# Patient Record
Sex: Male | Born: 1980 | Race: White | Hispanic: No | Marital: Married | Smoking: Current every day smoker
Health system: Southern US, Community
[De-identification: ages and names within clinical notes are randomized; demographics above are authoritative.]

---

## 2011-04-21 ENCOUNTER — Emergency Department (HOSPITAL_COMMUNITY): Payer: 59

## 2011-04-21 ENCOUNTER — Inpatient Hospital Stay (HOSPITAL_COMMUNITY)
Admission: EM | Admit: 2011-04-21 | Discharge: 2011-04-23 | DRG: 558 | Disposition: A | Discharge status: Home / Self Care | Payer: 59 | Attending: Internal Medicine | Admitting: Internal Medicine

## 2011-04-21 ENCOUNTER — Encounter: Payer: Self-pay | Admitting: Internal Medicine

## 2011-04-21 ENCOUNTER — Other Ambulatory Visit: Payer: Self-pay | Admitting: Emergency Medicine

## 2011-04-21 DIAGNOSIS — N179 Acute kidney failure, unspecified: Secondary | ICD-10-CM

## 2011-04-21 DIAGNOSIS — R51 Headache: Secondary | ICD-10-CM | POA: Diagnosis present

## 2011-04-21 DIAGNOSIS — M6282 Rhabdomyolysis: Principal | ICD-10-CM | POA: Diagnosis present

## 2011-04-21 LAB — CREATININE, URINE, RANDOM: Creatinine, Urine: 77.11 mg/dL

## 2011-04-21 LAB — COMPREHENSIVE METABOLIC PANEL
ALT: 57 U/L — ABNORMAL HIGH (ref 0–53)
AST: 22 U/L (ref 0–37)
Alkaline Phosphatase: 74 U/L (ref 39–117)
CO2: 24 mEq/L (ref 19–32)
Calcium: 8.9 mg/dL (ref 8.4–10.5)
Chloride: 102 mEq/L (ref 96–112)
GFR calc non Af Amer: 15 mL/min — ABNORMAL LOW (ref >60)
Glucose, Bld: 91 mg/dL (ref 70–99)
Potassium: 4.4 mEq/L (ref 3.5–5.1)
Sodium: 136 mEq/L (ref 135–145)
Total Bilirubin: 0.6 mg/dL (ref 0.3–1.2)

## 2011-04-21 LAB — CBC
Hemoglobin: 14.8 g/dL (ref 13.0–17.0)
Platelets: 216 10*3/uL (ref 150–400)
RBC: 5.38 MIL/uL (ref 4.22–5.81)
WBC: 10.1 10*3/uL (ref 4.0–10.5)

## 2011-04-21 LAB — DIFFERENTIAL
Basophils Absolute: 0.1 10*3/uL (ref 0.0–0.1)
Basophils Relative: 1 % (ref 0–1)
Eosinophils Absolute: 0.2 10*3/uL (ref 0.0–0.7)
Neutro Abs: 6.6 10*3/uL (ref 1.7–7.7)
Neutrophils Relative %: 65 % (ref 43–77)

## 2011-04-21 LAB — BASIC METABOLIC PANEL
Calcium: 8.7 mg/dL (ref 8.4–10.5)
Chloride: 102 mEq/L (ref 96–112)
Creatinine, Ser: 4.48 mg/dL — ABNORMAL HIGH (ref 0.50–1.35)
GFR calc Af Amer: 19 mL/min — ABNORMAL LOW (ref >60)
Sodium: 136 mEq/L (ref 135–145)

## 2011-04-21 LAB — RAPID URINE DRUG SCREEN, HOSP PERFORMED
Barbiturates: NOT DETECTED
Opiates: NOT DETECTED
Tetrahydrocannabinol: NOT DETECTED

## 2011-04-21 LAB — HIV ANTIBODY (ROUTINE TESTING W REFLEX): HIV: NONREACTIVE

## 2011-04-21 LAB — MICROALBUMIN, URINE: Microalb, Ur: 3.34 mg/dL — ABNORMAL HIGH (ref 0.00–1.89)

## 2011-04-21 LAB — URINALYSIS, ROUTINE W REFLEX MICROSCOPIC
Bilirubin Urine: NEGATIVE
Glucose, UA: NEGATIVE mg/dL
Protein, ur: NEGATIVE mg/dL

## 2011-04-21 LAB — PROTIME-INR: Prothrombin Time: 13.8 seconds (ref 11.6–15.2)

## 2011-04-21 LAB — URINE MICROSCOPIC-ADD ON

## 2011-04-21 LAB — SODIUM, URINE, RANDOM: Sodium, Ur: 49 mEq/L

## 2011-04-21 MED ORDER — IOHEXOL 300 MG/ML  SOLN
100.0000 mL | Freq: Once | INTRAMUSCULAR | Status: AC | PRN
  Administered 2011-04-21: 100 mL via INTRAVENOUS
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
## 2011-04-22 LAB — URINE CULTURE: Culture: NO GROWTH

## 2011-04-22 LAB — RENAL FUNCTION PANEL
Albumin: 3.3 g/dL — ABNORMAL LOW (ref 3.5–5.2)
Calcium: 8.5 mg/dL (ref 8.4–10.5)
GFR calc Af Amer: 20 mL/min — ABNORMAL LOW (ref >60)
GFR calc non Af Amer: 16 mL/min — ABNORMAL LOW (ref >60)
Phosphorus: 4.5 mg/dL (ref 2.3–4.6)
Sodium: 142 mEq/L (ref 135–145)

## 2011-04-22 LAB — C4 COMPLEMENT: Complement C4, Body Fluid: 29 mg/dL (ref 10–40)

## 2011-04-22 LAB — CBC
MCH: 28.9 pg (ref 26.0–34.0)
MCHC: 35.8 g/dL (ref 30.0–36.0)
Platelets: 200 10*3/uL (ref 150–400)

## 2011-04-23 LAB — PROTEIN ELECTROPH W RFLX QUANT IMMUNOGLOBULINS
Alpha-1-Globulin: 5 % — ABNORMAL HIGH (ref 2.9–4.9)
Beta 2: 5.6 % (ref 3.2–6.5)
Beta Globulin: 4.6 % — ABNORMAL LOW (ref 4.7–7.2)
Gamma Globulin: 14 % (ref 11.1–18.8)

## 2011-04-23 LAB — BASIC METABOLIC PANEL
BUN: 19 mg/dL (ref 6–23)
CO2: 30 mEq/L (ref 19–32)
Chloride: 104 mEq/L (ref 96–112)
Creatinine, Ser: 3.04 mg/dL — ABNORMAL HIGH (ref 0.50–1.35)
GFR calc Af Amer: 30 mL/min — ABNORMAL LOW (ref >60)
Glucose, Bld: 88 mg/dL (ref 70–99)

## 2011-04-23 LAB — UIFE/LIGHT CHAINS/TP QN, 24-HR UR
Alpha 1, Urine: DETECTED — AB
Alpha 2, Urine: DETECTED — AB
Free Kappa/Lambda Ratio: 10.29 ratio (ref 2.04–10.37)
Total Protein, Urine: 4.8 mg/dL

## 2011-04-23 LAB — RENAL FUNCTION PANEL
Calcium: 8.6 mg/dL (ref 8.4–10.5)
Creatinine, Ser: 3.31 mg/dL — ABNORMAL HIGH (ref 0.50–1.35)
GFR calc Af Amer: 27 mL/min — ABNORMAL LOW (ref >60)
Glucose, Bld: 93 mg/dL (ref 70–99)
Phosphorus: 4.5 mg/dL (ref 2.3–4.6)
Sodium: 143 mEq/L (ref 135–145)

## 2011-04-23 LAB — COMPLEMENT, TOTAL: Compl, Total (CH50): 39 U/mL (ref 31–60)

## 2011-04-24 LAB — GLUCOSE, CAPILLARY
Comment 1: 27532
Glucose-Capillary: 165 mg/dL — ABNORMAL HIGH (ref 70–99)
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
## 2011-04-25 ENCOUNTER — Other Ambulatory Visit (INDEPENDENT_AMBULATORY_CARE_PROVIDER_SITE_OTHER): Payer: 59 | Admitting: Internal Medicine

## 2011-04-25 DIAGNOSIS — N179 Acute kidney failure, unspecified: Secondary | ICD-10-CM

## 2011-04-25 DIAGNOSIS — M6282 Rhabdomyolysis: Secondary | ICD-10-CM

## 2011-04-25 LAB — MYOGLOBIN, URINE
Myoglobin, Ur: 59 mcg/L — ABNORMAL HIGH (ref <28)
Myoglobin, Ur: <27 mcg/L (ref <28)

## 2011-04-26 ENCOUNTER — Encounter: Payer: 59 | Admitting: Internal Medicine

## 2011-04-26 DIAGNOSIS — M6282 Rhabdomyolysis: Secondary | ICD-10-CM

## 2011-04-26 DIAGNOSIS — N179 Acute kidney failure, unspecified: Secondary | ICD-10-CM

## 2011-04-27 ENCOUNTER — Telehealth: Payer: Self-pay | Admitting: Internal Medicine

## 2011-04-27 LAB — BASIC METABOLIC PANEL
CO2: 27 mEq/L (ref 19–32)
Calcium: 9.8 mg/dL (ref 8.4–10.5)
Chloride: 103 mEq/L (ref 96–112)
Creat: 2.4 mg/dL — ABNORMAL HIGH (ref 0.50–1.35)
Potassium: 4.5 mEq/L (ref 3.5–5.3)
Sodium: 141 mEq/L (ref 135–145)

## 2011-04-27 NOTE — Telephone Encounter (Signed)
Called pt to inform him that his creatinine has continued to trend downward (now to 2.2).  Advised to continue to limit strenuous activity and to remain well hydrated.  Also informed patient that I am setting up a referral to [facility] for further eval of his rhabdomyolysis.  He should expect a call from them to set this up.

## 2011-11-07 ENCOUNTER — Encounter: Payer: 59 | Admitting: Internal Medicine

## 2013-03-08 NOTE — Progress Notes (Signed)
This encounter was created in error - please disregard.

## 2019-05-04 ENCOUNTER — Emergency Department (HOSPITAL_COMMUNITY)
Admission: EM | Admit: 2019-05-04 | Discharge: 2019-05-04 | Disposition: A | Discharge status: Home / Self Care | Payer: BLUE CROSS/BLUE SHIELD | Attending: Emergency Medicine | Admitting: Emergency Medicine

## 2019-05-04 ENCOUNTER — Encounter (HOSPITAL_COMMUNITY): Payer: Self-pay | Admitting: Emergency Medicine

## 2019-05-04 DIAGNOSIS — K625 Hemorrhage of anus and rectum: Secondary | ICD-10-CM

## 2019-05-04 LAB — CBC
HCT: 50.5 % (ref 39.0–52.0)
Hemoglobin: 16.9 g/dL (ref 13.0–17.0)
MCH: 28.7 pg (ref 26.0–34.0)
MCHC: 33.5 g/dL (ref 30.0–36.0)
MCV: 85.9 fL (ref 80.0–100.0)
Platelets: 297 10*3/uL (ref 150–400)
RBC: 5.88 MIL/uL — ABNORMAL HIGH (ref 4.22–5.81)
RDW: 12.5 % (ref 11.5–15.5)
WBC: 10 10*3/uL (ref 4.0–10.5)
nRBC: 0 % (ref 0.0–0.2)

## 2019-05-04 LAB — COMPREHENSIVE METABOLIC PANEL
ALT: 75 U/L — ABNORMAL HIGH (ref 0–44)
AST: 88 U/L — ABNORMAL HIGH (ref 15–41)
Albumin: 5 g/dL (ref 3.5–5.0)
Alkaline Phosphatase: 59 U/L (ref 38–126)
Anion gap: 10 (ref 5–15)
BUN: 11 mg/dL (ref 6–20)
CO2: 26 mmol/L (ref 22–32)
Calcium: 9.7 mg/dL (ref 8.9–10.3)
Chloride: 103 mmol/L (ref 98–111)
Creatinine, Ser: 1.07 mg/dL (ref 0.61–1.24)
GFR calc Af Amer: >60 mL/min (ref >60)
GFR calc non Af Amer: >60 mL/min (ref >60)
Glucose, Bld: 94 mg/dL (ref 70–99)
Potassium: 4 mmol/L (ref 3.5–5.1)
Sodium: 139 mmol/L (ref 135–145)
Total Bilirubin: 1.3 mg/dL — ABNORMAL HIGH (ref 0.3–1.2)
Total Protein: 8.1 g/dL (ref 6.5–8.1)

## 2019-05-04 NOTE — ED Provider Notes (Signed)
[facility] Note   CSN: [id number] Arrival date & time: 05/04/19  1417   History   Chief Complaint Chief Complaint  Patient presents with  . Rectal Bleeding   HPI Sean Andrews is a 38 y.o. male.     HPI   38 year old male presents today with complaints of rectal bleeding.  Patient notes since May he has had intermittent bleeding per rectum with bowel movements.  He notes this is red.  He denies any significant pain with this.  Patient notes it is not every time and is occasional.  He notes recently over the last several weeks it has been more frequent.  He notes blood on the toilet paper and some blood in the toilet bowl.  He notes he has had some epigastric abdominal discomfort over the last couple of days but notes a rib injury and feels this is caused by this.  He denies any change to the stools including dark or tarry stools.  He denies any weight loss or fever or change in consistency of the stools.  He has not had a colonoscopy.  He notes more frequent bowel movements recently.  No personal or family history of Crohn's or IBS.  History reviewed. No pertinent past medical history.  Patient Active Problem List   Diagnosis Date Noted  . Rhabdomyolysis 04/25/2011    History reviewed. No pertinent surgical history.      Home Medications    Prior to Admission medications   Medication Sig Start Date End Date Taking? Authorizing Provider  acetaminophen (TYLENOL) 325 MG tablet Take 325 mg by mouth every 6 (six) hours as needed.      Duwaine Maxinaisch, Michael C, MD    Family History No family history on file.  Social History Social History   Tobacco Use  . Smoking status: Not on file  Substance Use Topics  . Alcohol use: Not on file  . Drug use: Not on file     Allergies   Patient has no known allergies.   Review of Systems Review of Systems  All other systems reviewed and are negative.   Physical Exam Updated  Vital Signs BP 117/75   Pulse 74   Temp 98.2 F (36.8 C) (Oral)   Resp 16   SpO2 98%   Physical Exam Vitals signs and nursing note reviewed.  Constitutional:      Appearance: He is well-developed.  HENT:     Head: Normocephalic and atraumatic.  Eyes:     General: No scleral icterus.       Right eye: No discharge.        Left eye: No discharge.     Conjunctiva/sclera: Conjunctivae normal.     Pupils: Pupils are equal, round, and reactive to light.  Neck:     Musculoskeletal: Normal range of motion.     Vascular: No JVD.     Trachea: No tracheal deviation.  Pulmonary:     Effort: Pulmonary effort is normal.     Breath sounds: No stridor.  Abdominal:     General: There is no distension.     Palpations: Abdomen is soft. There is no mass.     Tenderness: There is no abdominal tenderness. There is no guarding or rebound.     Hernia: No hernia is present.  Neurological:     Mental Status: He is alert and oriented to person, place, and time.     Coordination: Coordination normal.  Psychiatric:  Behavior: Behavior normal.        Thought Content: Thought content normal.        Judgment: Judgment normal.     ED Treatments / Results  Labs (all labs ordered are listed, but only abnormal results are displayed) Labs Reviewed  COMPREHENSIVE METABOLIC PANEL - Abnormal; Notable for the following components:      Result Value   AST 88 (*)    ALT 75 (*)    Total Bilirubin 1.3 (*)    All other components within normal limits  CBC - Abnormal; Notable for the following components:   RBC 5.88 (*)    All other components within normal limits    EKG None  Radiology No results found.  Procedures Procedures (including critical care time)  Medications Ordered in ED Medications - No data to display   Initial Impression / Assessment and Plan / ED Course  I have reviewed the triage vital signs and the nursing notes.  Pertinent labs & imaging results that were available  during my care of the patient were reviewed by me and considered in my medical decision making (see chart for details).        Labs:   Imaging:  Consults:  Therapeutics:  Discharge Meds:   Assessment/Plan: Patient here with red blood per rectum.  This has been intermittent and worsening over last several weeks but has been going on since May.  Patient has reassuring laboratory analysis here.  He has no abdominal tenderness.  No signs of significant blood loss.  I discussed different options including CT scan and rectal exam.  Given the duration of symptoms with otherwise well appearance patient agreed that CT scan would not need to perform presently.  He has no signs of upper GI bleed, this is likely lower high suspicion for hemorrhoids.  Low suspicion for malignancy.  We discussed the need that he would follow-up with gastroenterology for further evaluation.  Given he is not having significant blood loss this is not a new etiology rectal exam was deferred to gastroenterology.  Patient will return immediately if develops any new or worsening signs or symptoms.  He verbalized understanding and agreement to today's plan had no further questions or concerns at time of discharge.   Final Clinical Impressions(s) / ED Diagnoses   Final diagnoses:  Rectal bleeding    ED Discharge Orders    None       Okey Regal, PA-C 05/06/19 1512    Malvin Johns, MD 05/07/19 1723

## 2019-05-04 NOTE — Discharge Instructions (Signed)
Please read attached information. If you experience any new or worsening signs or symptoms please return to the emergency room for evaluation. Please follow-up with your primary care provider or specialist as discussed. Please use medication prescribed only as directed and discontinue taking if you have any concerning signs or symptoms.   °

## 2019-05-04 NOTE — ED Triage Notes (Signed)
Pt reports for the last 3 weeks he has had rectal bleeding that has been coming and going with BM's. Pt states he has noticed at times there is bright red blood in stool. Pt states today he has a pain in the center of his abdomen he feels like it is cramping.

## 2019-05-04 NOTE — ED Notes (Signed)
Discharge instructions discussed with Pt. Pt verbalized understanding. Pt stable and ambulatory.    

## 2019-05-06 ENCOUNTER — Emergency Department (HOSPITAL_BASED_OUTPATIENT_CLINIC_OR_DEPARTMENT_OTHER): Payer: BLUE CROSS/BLUE SHIELD

## 2019-05-06 ENCOUNTER — Encounter (HOSPITAL_BASED_OUTPATIENT_CLINIC_OR_DEPARTMENT_OTHER): Payer: Self-pay

## 2019-05-06 ENCOUNTER — Other Ambulatory Visit: Payer: Self-pay

## 2019-05-06 ENCOUNTER — Ambulatory Visit
Admission: RE | Admit: 2019-05-06 | Discharge: 2019-05-06 | Disposition: A | Discharge status: Home / Self Care | Payer: BLUE CROSS/BLUE SHIELD | Source: Ambulatory Visit

## 2019-05-06 ENCOUNTER — Emergency Department (HOSPITAL_BASED_OUTPATIENT_CLINIC_OR_DEPARTMENT_OTHER)
Admission: EM | Admit: 2019-05-06 | Discharge: 2019-05-07 | Disposition: A | Discharge status: Home / Self Care | Payer: BLUE CROSS/BLUE SHIELD | Attending: Emergency Medicine | Admitting: Emergency Medicine

## 2019-05-06 DIAGNOSIS — K625 Hemorrhage of anus and rectum: Secondary | ICD-10-CM | POA: Diagnosis present

## 2019-05-06 DIAGNOSIS — K5909 Other constipation: Secondary | ICD-10-CM

## 2019-05-06 DIAGNOSIS — F17210 Nicotine dependence, cigarettes, uncomplicated: Secondary | ICD-10-CM | POA: Insufficient documentation

## 2019-05-06 DIAGNOSIS — R1013 Epigastric pain: Secondary | ICD-10-CM | POA: Insufficient documentation

## 2019-05-06 DIAGNOSIS — K5900 Constipation, unspecified: Secondary | ICD-10-CM | POA: Diagnosis not present

## 2019-05-06 LAB — COMPREHENSIVE METABOLIC PANEL
ALT: 51 U/L — ABNORMAL HIGH (ref 0–44)
AST: 52 U/L — ABNORMAL HIGH (ref 15–41)
Albumin: 4.7 g/dL (ref 3.5–5.0)
Alkaline Phosphatase: 55 U/L (ref 38–126)
Anion gap: 11 (ref 5–15)
BUN: 16 mg/dL (ref 6–20)
CO2: 26 mmol/L (ref 22–32)
Calcium: 9.4 mg/dL (ref 8.9–10.3)
Chloride: 103 mmol/L (ref 98–111)
Creatinine, Ser: 1.22 mg/dL (ref 0.61–1.24)
GFR calc Af Amer: >60 mL/min (ref >60)
GFR calc non Af Amer: >60 mL/min (ref >60)
Glucose, Bld: 84 mg/dL (ref 70–99)
Potassium: 4.1 mmol/L (ref 3.5–5.1)
Sodium: 140 mmol/L (ref 135–145)
Total Bilirubin: 0.6 mg/dL (ref 0.3–1.2)
Total Protein: 7.4 g/dL (ref 6.5–8.1)

## 2019-05-06 LAB — LIPASE, BLOOD: Lipase: 37 U/L (ref 11–51)

## 2019-05-06 LAB — CBC WITH DIFFERENTIAL/PLATELET
Abs Immature Granulocytes: 0.03 10*3/uL (ref 0.00–0.07)
Basophils Absolute: 0.1 10*3/uL (ref 0.0–0.1)
Basophils Relative: 1 %
Eosinophils Absolute: 0.1 10*3/uL (ref 0.0–0.5)
Eosinophils Relative: 1 %
HCT: 48.4 % (ref 39.0–52.0)
Hemoglobin: 15.6 g/dL (ref 13.0–17.0)
Immature Granulocytes: 0 %
Lymphocytes Relative: 30 %
Lymphs Abs: 3.2 10*3/uL (ref 0.7–4.0)
MCH: 27.9 pg (ref 26.0–34.0)
MCHC: 32.2 g/dL (ref 30.0–36.0)
MCV: 86.6 fL (ref 80.0–100.0)
Monocytes Absolute: 1.2 10*3/uL — ABNORMAL HIGH (ref 0.1–1.0)
Monocytes Relative: 11 %
Neutro Abs: 6.1 10*3/uL (ref 1.7–7.7)
Neutrophils Relative %: 57 %
Platelets: 278 10*3/uL (ref 150–400)
RBC: 5.59 MIL/uL (ref 4.22–5.81)
RDW: 12.8 % (ref 11.5–15.5)
WBC: 10.7 10*3/uL — ABNORMAL HIGH (ref 4.0–10.5)
nRBC: 0 % (ref 0.0–0.2)

## 2019-05-06 LAB — OCCULT BLOOD X 1 CARD TO LAB, STOOL: Fecal Occult Bld: NEGATIVE

## 2019-05-06 MED ORDER — SUCRALFATE 1 GM/10ML PO SUSP: 1.0000 g | Freq: Three times a day (TID) | ORAL | 0 refills | Status: AC

## 2019-05-06 MED ORDER — LIDOCAINE VISCOUS HCL 2 % MT SOLN
15.0000 mL | Freq: Once | OROMUCOSAL | Status: AC
  Administered 2019-05-06: 22:00:00 15 mL via ORAL
  Filled 2019-05-06: qty 15

## 2019-05-06 MED ORDER — FAMOTIDINE IN NACL 20-0.9 MG/50ML-% IV SOLN
20.0000 mg | Freq: Once | INTRAVENOUS | Status: AC
  Administered 2019-05-06: 20 mg via INTRAVENOUS
  Filled 2019-05-06: qty 50

## 2019-05-06 MED ORDER — ALUM & MAG HYDROXIDE-SIMETH 200-200-20 MG/5ML PO SUSP
30.0000 mL | Freq: Once | ORAL | Status: AC
  Administered 2019-05-06: 22:00:00 30 mL via ORAL
  Filled 2019-05-06: qty 30

## 2019-05-06 MED ORDER — IOHEXOL 300 MG/ML  SOLN
100.0000 mL | Freq: Once | INTRAMUSCULAR | Status: AC | PRN
  Administered 2019-05-06: 23:00:00 100 mL via INTRAVENOUS

## 2019-05-06 MED ORDER — SODIUM CHLORIDE 0.9 % IV BOLUS
1000.0000 mL | Freq: Once | INTRAVENOUS | Status: AC
  Administered 2019-05-06: 22:00:00 1000 mL via INTRAVENOUS

## 2019-05-06 MED ORDER — FAMOTIDINE 20 MG PO TABS: 20.0000 mg | ORAL_TABLET | Freq: Two times a day (BID) | ORAL | 0 refills | Status: AC

## 2019-05-06 NOTE — ED Triage Notes (Addendum)
pt c/o bright red blood in stools since May-was seen for same 2 days ago at [facility] ED-pt c/o cont'd abd pain and nausea-NAD-steady gait

## 2019-05-06 NOTE — Discharge Instructions (Addendum)
Follow-up with GI as directed.  Take Pepcid and Carafate as directed.  Take miralax for constipation   Return to the Emergency Department immediately if you experience any worsening abdominal pain, fever, persistent nausea and vomiting, inability keep any food down, pain with urination, blood in your urine or any other worsening or concerning symptoms.

## 2019-05-06 NOTE — ED Notes (Signed)
Patient transported to CT 

## 2019-05-06 NOTE — ED Provider Notes (Signed)
[facility] EMERGENCY DEPARTMENT Provider Note   CSN: [id number] Arrival date & time: 05/06/19  2018    History   Chief Complaint Chief Complaint  Patient presents with   Rectal Bleeding   Abdominal Pain    HPI Rogen Porte is a 38 y.o. male who presents for evaluation of upper abdominal pain and rectal bleeding.  Patient reports that for the last 3 months, he has had some intermittent rectal bleeding.  He states that this has been an ongoing issue.  He states that is always been bright red blood and has never had any melena.  He says sometimes the blood is in the stool is not it is not toilet bowl.  He has had some associated diffuse abdominal pain associated with this.  He states that mostly his abdominal pain is located in the epigastric region and then radiates downward.  He states that this always occurs after eating.  He was seen in the ED on 05/04/2019 for evaluation of symptoms.  His work-up was unremarkable at that time, at that time, he was offered a CT scan but he declined.  He states that as he went home, he continued to have some abdominal pain that felt like worsened today.  He states that he ate something today and started noting some epigastric abdominal pain that was worse than his normal pain.  He has some associated nausea but no vomiting.  He has not any more episodes of rectal bleeding.  He has not noted any fevers.  He has no previous GI issues.  Does not take frequent NSAIDs.  He is not currently on blood thinners.  Patient also reports a separate issue but he does not know if it is contributing.  He reports that a week ago, he was hit on the right side of his ribs while playing hockey.  He states that the pain has been continuous from that.  He denies any difficulty breathing, cough, urinary complaints.  He denies any recent travel.  He denies any recent known COVID-19 exposure.  He was recently tested a few days ago and was negative.     The history is  provided by the patient.    History reviewed. No pertinent past medical history.  Patient Active Problem List   Diagnosis Date Noted   Rhabdomyolysis 04/25/2011    History reviewed. No pertinent surgical history.      Home Medications    Prior to Admission medications   Medication Sig Start Date End Date Taking? Authorizing Provider  acetaminophen (TYLENOL) 325 MG tablet Take 325 mg by mouth every 6 (six) hours as needed.      Clarene Duke, MD  famotidine (PEPCID) 20 MG tablet Take 1 tablet (20 mg total) by mouth 2 (two) times daily. 05/06/19   Volanda Napoleon, PA-C  sucralfate (CARAFATE) 1 GM/10ML suspension Take 10 mLs (1 g total) by mouth 4 (four) times daily -  with meals and at bedtime. 05/06/19   Volanda Napoleon, PA-C    Family History No family history on file.  Social History Social History   Tobacco Use   Smoking status: Current Every Day Smoker    Types: Cigarettes   Smokeless tobacco: Never Used  Substance Use Topics   Alcohol use: Yes    Comment: occ   Drug use: Never     Allergies   Patient has no known allergies.   Review of Systems Review of Systems  Constitutional: Negative for fever.  Respiratory: Negative for cough and shortness of breath.   Cardiovascular: Negative for chest pain.  Gastrointestinal: Positive for abdominal pain and blood in stool. Negative for nausea and vomiting.  Genitourinary: Negative for dysuria and hematuria.  Musculoskeletal:       Chest wall pain  Neurological: Negative for headaches.  All other systems reviewed and are negative.    Physical Exam Updated Vital Signs BP 119/79 (BP Location: Right Arm)    Pulse 61    Temp 98.2 F (36.8 C) (Oral)    Resp 16    Ht 5\' 11"  (1.803 m)    Wt 91.3 kg    SpO2 100%    BMI 28.07 kg/m   Physical Exam Vitals signs and nursing note reviewed.  Constitutional:      Appearance: Normal appearance. He is well-developed.     Comments: Sitting comfortably on  examination table  HENT:     Head: Normocephalic and atraumatic.  Eyes:     General: Lids are normal.     Conjunctiva/sclera: Conjunctivae normal.     Pupils: Pupils are equal, round, and reactive to light.  Neck:     Musculoskeletal: Full passive range of motion without pain.  Cardiovascular:     Rate and Rhythm: Normal rate and regular rhythm.     Pulses: Normal pulses.     Heart sounds: Normal heart sounds. No murmur. No friction rub. No gallop.   Pulmonary:     Effort: Pulmonary effort is normal.     Breath sounds: Normal breath sounds.  Chest:       Comments: Tenderness palpation noted to anterior left chest wall.  No deformity or crepitus noted. Abdominal:     Palpations: Abdomen is soft. Abdomen is not rigid.     Tenderness: There is abdominal tenderness in the epigastric area. There is no guarding.     Comments: Abdomen is soft, nondistended.  Tenderness palpation in epigastric region.  No rigidity, guarding.  Musculoskeletal: Normal range of motion.  Skin:    General: Skin is warm and dry.     Capillary Refill: Capillary refill takes less than 2 seconds.  Neurological:     Mental Status: He is alert and oriented to person, place, and time.  Psychiatric:        Speech: Speech normal.      ED Treatments / Results  Labs (all labs ordered are listed, but only abnormal results are displayed) Labs Reviewed  CBC WITH DIFFERENTIAL/PLATELET - Abnormal; Notable for the following components:      Result Value   WBC 10.7 (*)    Monocytes Absolute 1.2 (*)    All other components within normal limits  COMPREHENSIVE METABOLIC PANEL - Abnormal; Notable for the following components:   AST 52 (*)    ALT 51 (*)    All other components within normal limits  OCCULT BLOOD X 1 CARD TO LAB, STOOL  LIPASE, BLOOD    EKG None  Radiology Dg Chest 2 View  Result Date: 05/06/2019 CLINICAL DATA:  Epigastric pain x1 week EXAM: CHEST - 2 VIEW COMPARISON:  None. FINDINGS: Lungs are  clear.  No pleural effusion or pneumothorax. The heart is normal in size. Visualized osseous structures are within normal limits. IMPRESSION: Normal chest radiographs. Electronically Signed   By: Charline BillsSriyesh  Krishnan M.D.   On: 05/06/2019 23:03   Ct Abdomen Pelvis W Contrast  Result Date: 05/06/2019 CLINICAL DATA:  Acute abdominal pain EXAM: CT ABDOMEN AND PELVIS WITH CONTRAST TECHNIQUE:  Multidetector CT imaging of the abdomen and pelvis was performed using the standard protocol following bolus administration of intravenous contrast. CONTRAST:  100mL OMNIPAQUE IOHEXOL 300 MG/ML  SOLN COMPARISON:  None. FINDINGS: Lower chest: The visualized heart size within normal limits. No pericardial fluid/thickening. No hiatal hernia. Hepatobiliary: The liver is normal in density without focal abnormality.The main portal vein is patent. No evidence of calcified gallstones, gallbladder wall thickening or biliary dilatation. Pancreas: Unremarkable. No pancreatic ductal dilatation or surrounding inflammatory changes. Spleen: Normal in size without focal abnormality. Adrenals/Urinary Tract: Both adrenal glands appear normal. The kidneys and collecting system appear normal without evidence of urinary tract calculus or hydronephrosis. Bladder is unremarkable. Stomach/Bowel: The stomach, and small bowel are normal in appearance. Moderate amount of colonic stool is present. No inflammatory changes, wall thickening, or obstructive findings.The appendix is normal. Vascular/Lymphatic: There are no enlarged mesenteric, retroperitoneal, or pelvic lymph nodes. No significant vascular findings are present. Reproductive: The prostate is unremarkable. Other: No evidence of abdominal wall mass or hernia. Small fat containing inguinal hernias are seen. Musculoskeletal: No acute or significant osseous findings. IMPRESSION: Moderate amount colonic stool without evidence of obstruction. No other acute intra-abdominal or pelvic pathology to explain  the patient's symptoms. Electronically Signed   By: Jonna ClarkBindu  Avutu M.D.   On: 05/06/2019 23:42    Procedures Procedures (including critical care time)  Medications Ordered in ED Medications  alum & mag hydroxide-simeth (MAALOX/MYLANTA) 200-200-20 MG/5ML suspension 30 mL (30 mLs Oral Given 05/06/19 2146)    And  lidocaine (XYLOCAINE) 2 % viscous mouth solution 15 mL (15 mLs Oral Given 05/06/19 2146)  famotidine (PEPCID) IVPB 20 mg premix ( Intravenous Stopped 05/06/19 2221)  sodium chloride 0.9 % bolus 1,000 mL (0 mLs Intravenous Stopped 05/06/19 2313)  iohexol (OMNIPAQUE) 300 MG/ML solution 100 mL (100 mLs Intravenous Contrast Given 05/06/19 2235)     Initial Impression / Assessment and Plan / ED Course  I have reviewed the triage vital signs and the nursing notes.  Pertinent labs & imaging results that were available during my care of the patient were reviewed by me and considered in my medical decision making (see chart for details).        38 year old male who presents for evaluation of abdominal pain.  He also has been having rectal bleeding.  This is been an intermittent issue since May 2020.  He was seen in the ED 2 days ago for evaluation of symptoms and declined CT imaging at that time.  Comes in today because he feels like the abdominal pain is getting worse.  Concern for hemorrhoids versus PUD.  Low suspicion for GI bleed but also consideration.  Suspicion for diverticulitis given tenderness on exam but also consideration.  Plan for labs.  I discussed with patient regarding further treatment options here in the ED.  Patient is requesting CT on pelvis at this time.  We will add on additional chest x-ray for evaluation of chest wall injury.  CBC shows a leukocytosis of 10.7.  2 days ago, it was stable at 10.  Hemoglobin stable.  CMP shows slight bump in AST and ALT though improved from previous.  Lipase unremarkable. Fecal occult negative.   Patient signed out to Dr. Silverio LayYao pending CT  abd/pelvis and CXR.   Portions of this note were generated with Scientist, clinical (histocompatibility and immunogenetics)Dragon dictation software. Dictation errors may occur despite best attempts at proofreading.   Final Clinical Impressions(s) / ED Diagnoses   Final diagnoses:  Epigastric abdominal pain  Rectal  bleeding  Other constipation    ED Discharge Orders         Ordered    famotidine (PEPCID) 20 MG tablet  2 times daily     05/06/19 2151    sucralfate (CARAFATE) 1 GM/10ML suspension  3 times daily with meals & bedtime     05/06/19 2151           Maxwell CaulLayden, Cartier Mapel A, PA-C 05/07/19 1511    Charlynne PanderYao, David Hsienta, MD 05/10/19 [phone]

## 2019-05-06 NOTE — ED Provider Notes (Signed)
[facility] EMERGENCY DEPT VIDEO VISIT Provider Note   CSN: [id number] Arrival date & time:        History   Chief Complaint Abdominal pain  HPI Sean Andrews is a 38 y.o. male presenting for virtual visit with chief complaint of abdominal pain. Pain has been going on for about a week and seems worse today. He does report he was hit in ribs playing hockey x 1 week ago. At first he thought the pain was from that, but now is concerned about his abdomen. He describes the abdominal pain as cramping. It is located in epigastric area and radiates throughout abdomen. He rates pain 2/10 in severity, pain is worse after eating and with movement. He admits to associated nausea without emesis. He has not taken any medications for his symptoms.  Patient was seen in ED x2 days ago for rectal bleeding.  He reported intermittent bright red on the toilet paper and in the toilet bowl.  This is been going on since x3 months.  Labs yesterday with CMP with elevated, AST and ALT.  Slight elevation in total bilirubin. He declined CT A/P, deferred rectal exam and planned to follow up with GI. He called for outpatient GI follow up and is unable to get an appointment until late August.  He denies fever, chills, black or dark tarry stool, weight loss.  Also denies history of Crohn's disease and IBS.  History reviewed. No pertinent past medical history.  Patient Active Problem List   Diagnosis Date Noted  . Rhabdomyolysis 04/25/2011    History reviewed. No pertinent surgical history.      Home Medications    Prior to Admission medications   Medication Sig Start Date End Date Taking? Authorizing Provider  acetaminophen (TYLENOL) 325 MG tablet Take 325 mg by mouth every 6 (six) hours as needed.      Duwaine Maxinaisch, Michael C, MD    Family History History reviewed. No pertinent family history.  Social History Social History   Tobacco Use  . Smoking status: Not on file  Substance Use Topics  . Alcohol use: Not  on file  . Drug use: Not on file     Allergies   Patient has no known allergies.   Review of Systems Review of Systems  Constitutional: Negative for chills and fever.  HENT: Negative for congestion, rhinorrhea, sinus pressure and sore throat.   Eyes: Negative for pain and redness.  Respiratory: Negative for cough, shortness of breath and wheezing.   Cardiovascular: Negative for chest pain and palpitations.  Gastrointestinal: Positive for abdominal pain, blood in stool and nausea. Negative for constipation, diarrhea and vomiting.  Genitourinary: Negative for dysuria.  Musculoskeletal: Negative for arthralgias, back pain, myalgias and neck pain.  Skin: Negative for rash and wound.  Neurological: Negative for dizziness, syncope, weakness, numbness and headaches.  Psychiatric/Behavioral: Negative for confusion.     Physical Exam Updated Vital Signs There were no vitals taken for this visit.  Physical Exam Neurological:     Mental Status: He is alert and oriented to person, place, and time.  Psychiatric:        Mood and Affect: Mood normal.        Behavior: Behavior normal.        Thought Content: Thought content normal.     ED Treatments / Results  Labs (all labs ordered are listed, but only abnormal results are displayed) Labs Reviewed - No data to display  EKG None  Radiology No results found.  Procedures Procedures (including critical care time)  Medications Ordered in ED Medications - No data to display   Initial Impression / Assessment and Plan / ED Course  I have reviewed the triage vital signs and the nursing notes.  Pertinent labs & imaging results that were available during my care of the patient were reviewed by me and considered in my medical decision making (see chart for details).  Patient/guardian understands the limitations in scope of a virtual visit: Yes  Patient/guardian provides consent to proceed with a virtual visit: Yes       Pt with  abdominal pain x 1 week. DDx includes bilary colic, cholecystitis, pancreatitis, appendicitis, gastritis, PUD, IBS, less likely SBO. Labs viewed from 2 days ago and pt has elevated liver enzymes. No leukocytosis, no renal insufficiency or electrolyte abnormalities. Stale hemoglobin at 16.9. I feel that pt would benefit from imaging given he is unable to follow up in timely manner with GI.                                                              During the course of this virtual visit it was determined that the patient's complaint and/or medical complexity prevented an adequate assessment and treatment via a telehealth platform.  The patient was instructed to go immediately to the ED.   Patient/family understands that if they do not seek additional evaluation and care their condition could worsen, resulting in permanent disability or death.     Total time used for this encounter: 15 minutes   This encounter was completed through [facility] at the request of, and with the consent of the patient. The encounter took place during the Covid-19 national pandemic when limiting virus exposure to the community at acute care setting is considered an important step in reducing transmission of the disease. The technology provided at the hospital for this encounter are in compliance with HIPPA laws and was conducted over the telephone because of technical issues and unable to connect via video.   Final Clinical Impressions(s) / ED Diagnoses   Final diagnoses:  None    ED Discharge Orders    None       Flint Melter 05/06/19 2039    Varney Biles, MD 05/07/19 [phone]

## 2019-05-06 NOTE — Discharge Instructions (Signed)
Recommend further evaluation at the emergency department.

## 2020-01-13 ENCOUNTER — Ambulatory Visit: Payer: BLUE CROSS/BLUE SHIELD | Attending: Internal Medicine

## 2020-01-13 DIAGNOSIS — Z23 Encounter for immunization: Secondary | ICD-10-CM

## 2020-01-13 NOTE — Progress Notes (Signed)
   Covid-19 Vaccination Clinic  Name:  Sean Andrews    MRN: [id number] DOB: 08/23/1981  01/13/2020  Mr. Sean Andrews was observed post Covid-19 immunization for 15 minutes without incident. He was provided with Vaccine Information Sheet and instruction to access the V-Safe system.   Mr. Sean Andrews was instructed to call 911 with any severe reactions post vaccine: Marland Kitchen Difficulty breathing  . Swelling of face and throat  . A fast heartbeat  . A bad rash all over body  . Dizziness and weakness   Immunizations Administered    Name Date Dose VIS Date Route   Pfizer COVID-19 Vaccine 01/13/2020  8:22 AM 0.3 mL 09/17/2019 Intramuscular   Manufacturer: [employer], [employer]   Lot: [id number]   NDC: [address]-0

## 2020-02-07 ENCOUNTER — Ambulatory Visit: Payer: BLUE CROSS/BLUE SHIELD | Attending: Internal Medicine

## 2020-02-07 DIAGNOSIS — Z23 Encounter for immunization: Secondary | ICD-10-CM

## 2020-02-07 NOTE — Progress Notes (Signed)
   Covid-19 Vaccination Clinic  Name:  Sean Andrews    MRN: [id number] DOB: 05-30-1981  02/07/2020  [name] was observed post Covid-19 immunization for 15 minutes without incident. He was provided with Vaccine Information Sheet and instruction to access the V-Safe system.   [name] was instructed to call 911 with any severe reactions post vaccine: Marland Kitchen Difficulty breathing  . Swelling of face and throat  . A fast heartbeat  . A bad rash all over body  . Dizziness and weakness   Immunizations Administered    Name Date Dose VIS Date Route   Pfizer COVID-19 Vaccine 02/07/2020  8:15 AM 0.3 mL 12/01/2018 Intramuscular   Manufacturer: [employer], [employer]   Lot: [id number]   NDC: [address]-0

## 2020-05-28 IMAGING — CT CT ABDOMEN AND PELVIS WITH CONTRAST
2 of 4 series · 16 of 46 positions shown, 18 images · IV contrast (APPLIED)
Comparison: None.

CLINICAL DATA: Acute abdominal pain

EXAM:
CT ABDOMEN AND PELVIS WITH CONTRAST
TECHNIQUE: Multidetector CT imaging of the abdomen and pelvis was performed
using the standard protocol following bolus administration of
intravenous contrast.
CONTRAST:  100mL OMNIPAQUE IOHEXOL 300 MG/ML  SOLN

[Series 2: axial st · axial · 0.85mm/px · z∈[-652,-187]mm · 13 of 103 slices shown, 15 images]
[im 5/103  soft-tissue]
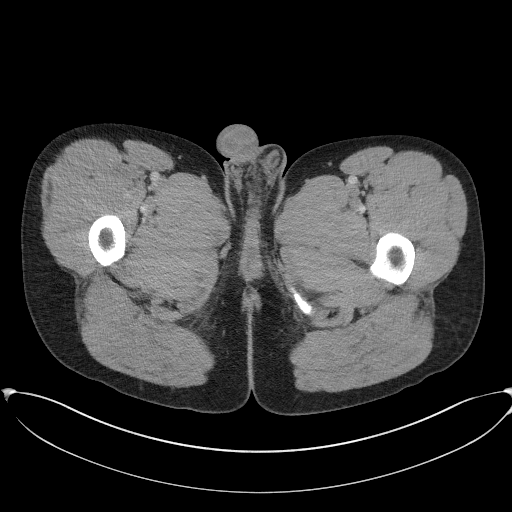
[im 5/103  bone]
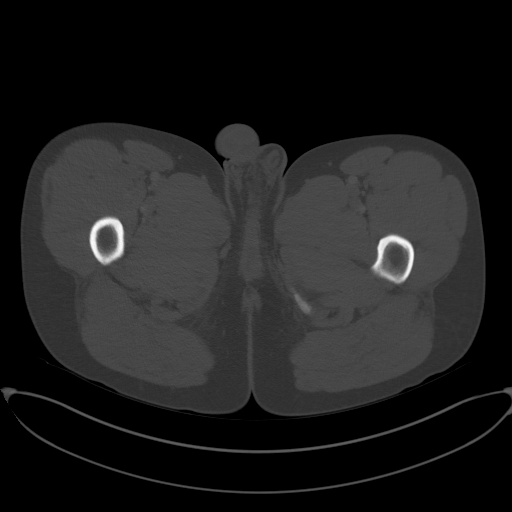
[im 14/103  soft-tissue]
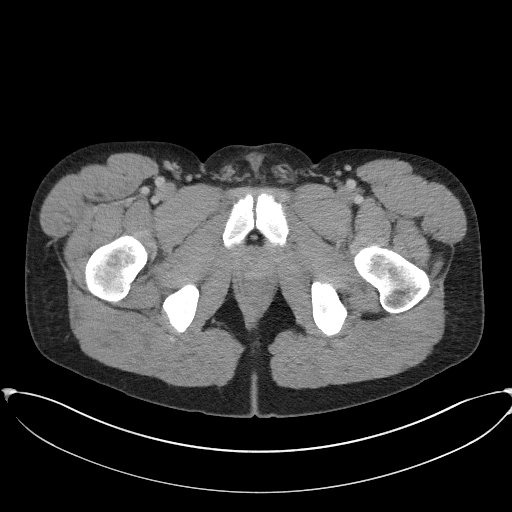
[im 23/103  soft-tissue]
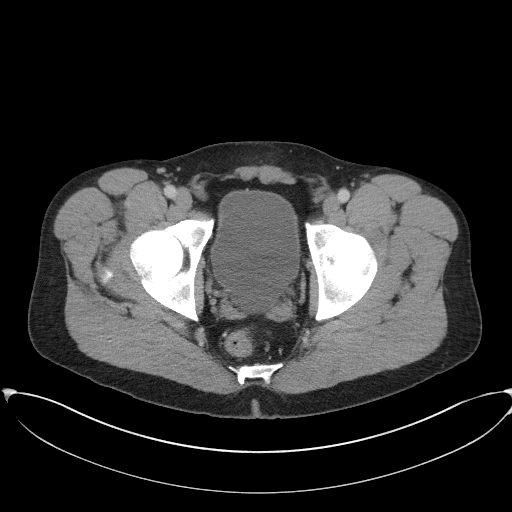
[im 27/103  soft-tissue]
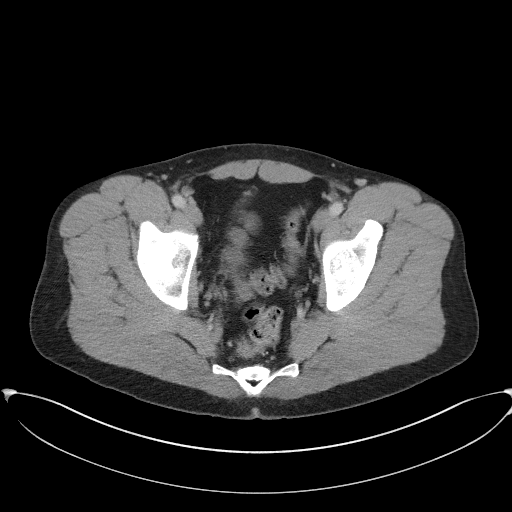
[im 36/103  soft-tissue]
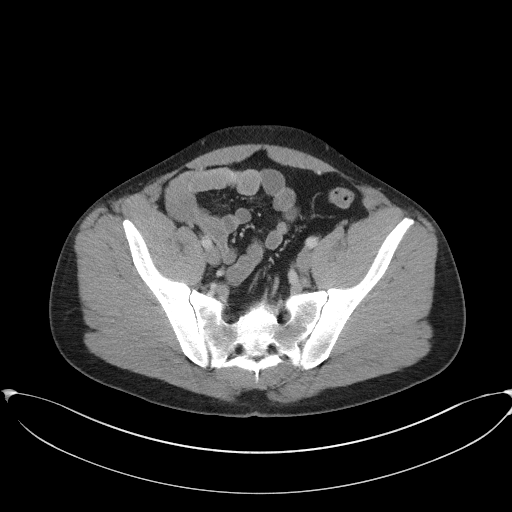
[im 45/103  soft-tissue]
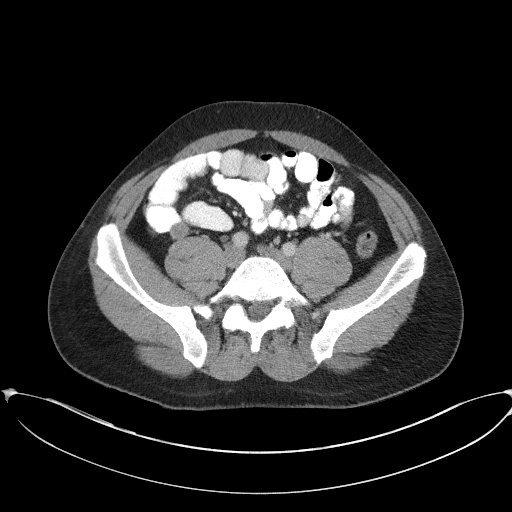
[im 54/103  soft-tissue]
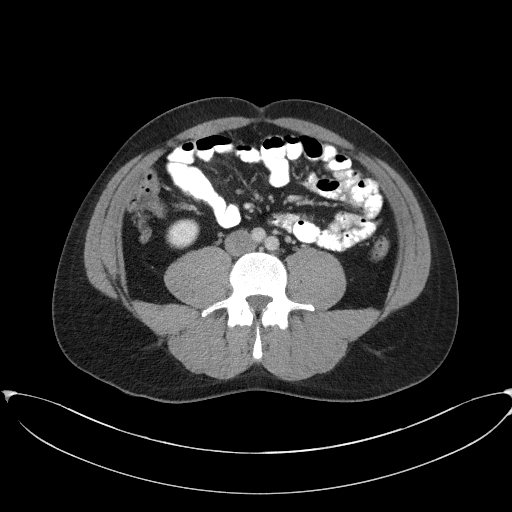
[im 58/103  soft-tissue]
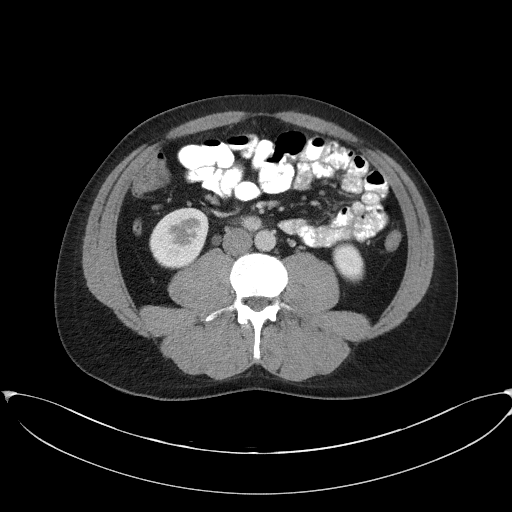
[im 67/103  soft-tissue]
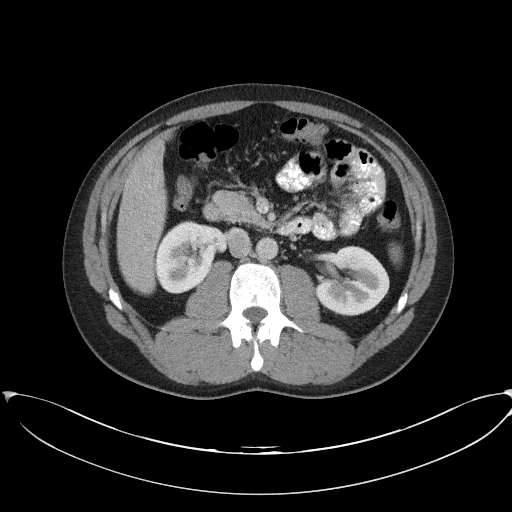
[im 67/103  bone]
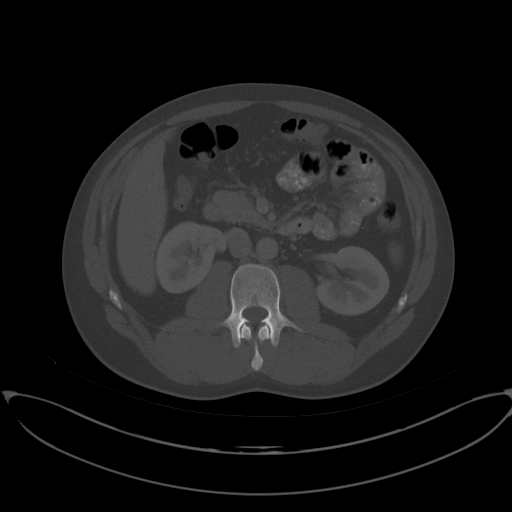
[im 76/103  soft-tissue]
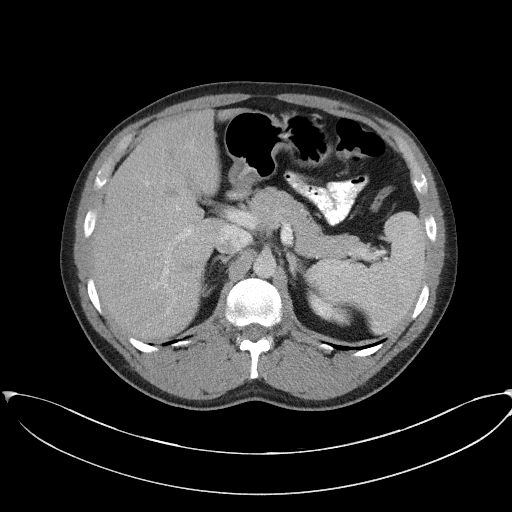
[im 80/103  soft-tissue]
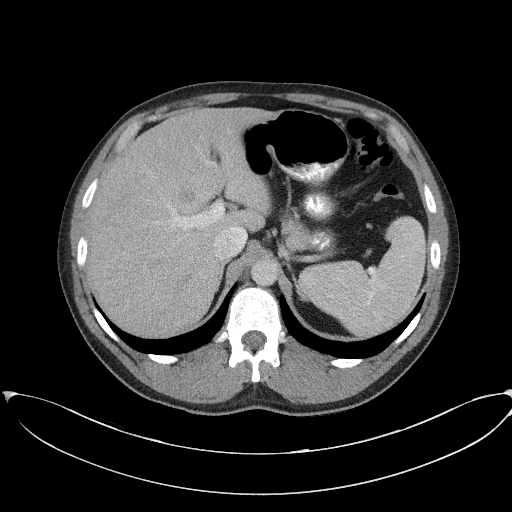
[im 89/103  soft-tissue]
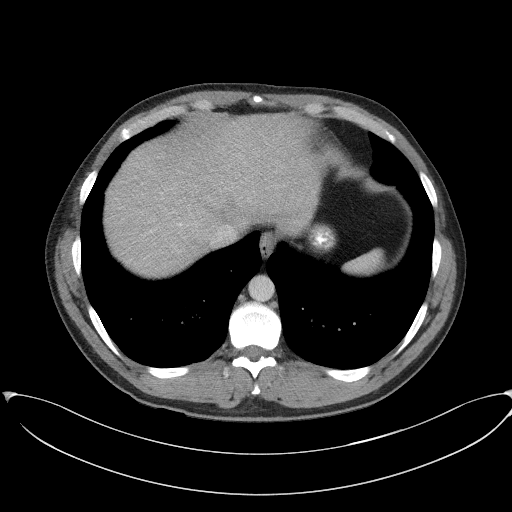
[im 98/103  soft-tissue]
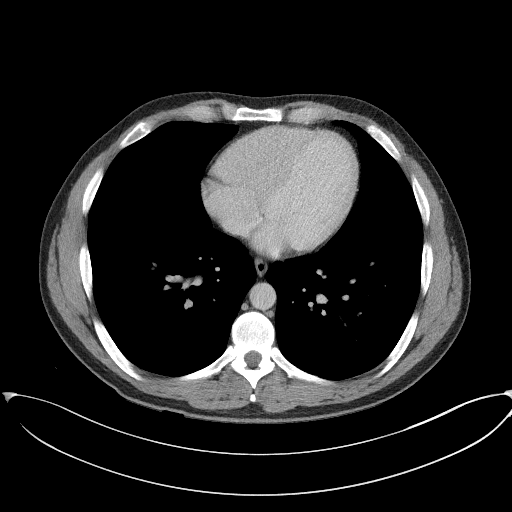

[Series 5: coronal st · coronal · 0.76mm/px · 3 of 100 slices shown]
[im 34/100  soft-tissue]
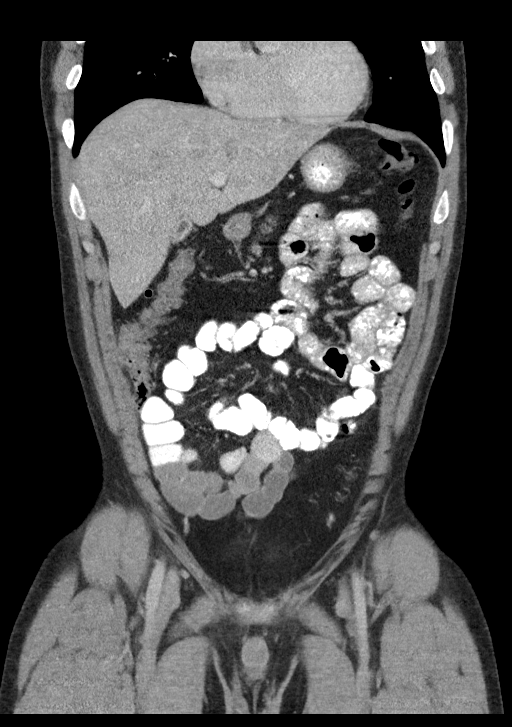
[im 45/100  soft-tissue]
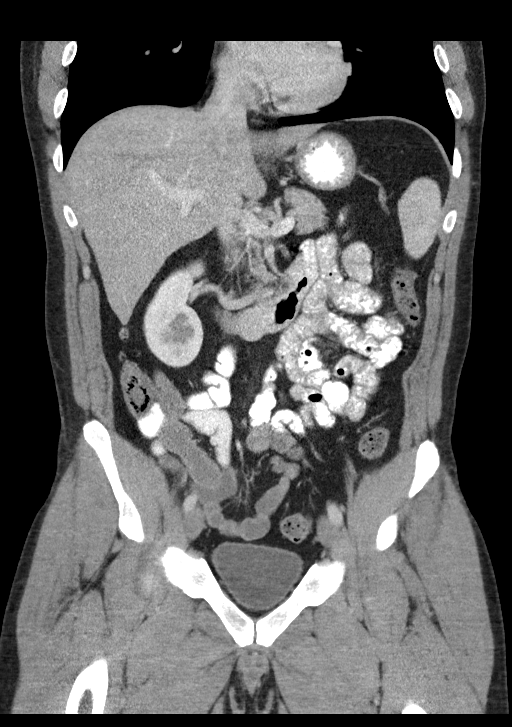
[im 56/100  soft-tissue]
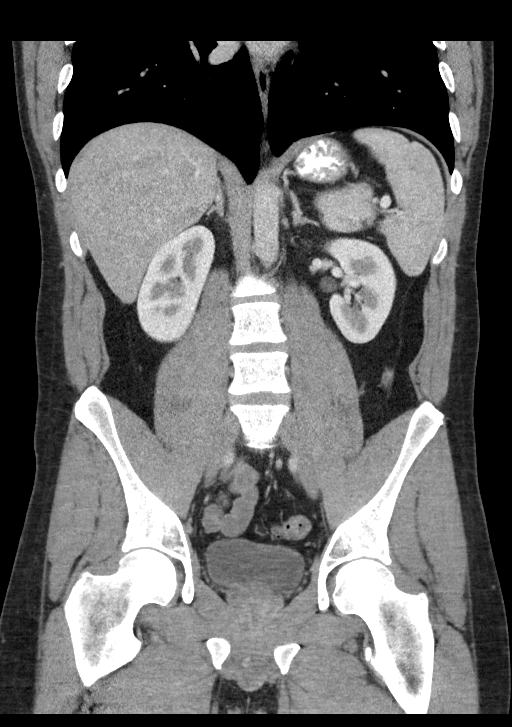

[16 of 46 positions shown; findings below may reference images not displayed]

FINDINGS: Lower chest: The visualized heart size within normal limits. No
pericardial fluid/thickening.

No hiatal hernia.

Hepatobiliary: The liver is normal in density without focal
abnormality.The main portal vein is patent. No evidence of calcified
gallstones, gallbladder wall thickening or biliary dilatation.

Pancreas: Unremarkable. No pancreatic ductal dilatation or
surrounding inflammatory changes.

Spleen: Normal in size without focal abnormality.

Adrenals/Urinary Tract: Both adrenal glands appear normal. The
kidneys and collecting system appear normal without evidence of
urinary tract calculus or hydronephrosis. Bladder is unremarkable.

Stomach/Bowel: The stomach, and small bowel are normal in
appearance. Moderate amount of colonic stool is present. No
inflammatory changes, wall thickening, or obstructive findings.The
appendix is normal.

Vascular/Lymphatic: There are no enlarged mesenteric,
retroperitoneal, or pelvic lymph nodes. No significant vascular
findings are present.

Reproductive: The prostate is unremarkable.

Other: No evidence of abdominal wall mass or hernia. Small fat
containing inguinal hernias are seen.

Musculoskeletal: No acute or significant osseous findings.
IMPRESSION: Moderate amount colonic stool without evidence of obstruction. No
other acute intra-abdominal or pelvic pathology to explain the
patient's symptoms.

## 2021-09-09 ENCOUNTER — Encounter (HOSPITAL_BASED_OUTPATIENT_CLINIC_OR_DEPARTMENT_OTHER): Payer: Self-pay | Admitting: *Deleted

## 2021-09-09 ENCOUNTER — Other Ambulatory Visit: Payer: Self-pay

## 2021-09-09 ENCOUNTER — Emergency Department (HOSPITAL_BASED_OUTPATIENT_CLINIC_OR_DEPARTMENT_OTHER): Payer: 59 | Admitting: Radiology

## 2021-09-09 ENCOUNTER — Emergency Department (HOSPITAL_BASED_OUTPATIENT_CLINIC_OR_DEPARTMENT_OTHER)
Admission: EM | Admit: 2021-09-09 | Discharge: 2021-09-09 | Disposition: A | Discharge status: Home / Self Care | Payer: 59 | Attending: Emergency Medicine | Admitting: Emergency Medicine

## 2021-09-09 DIAGNOSIS — S4991XA Unspecified injury of right shoulder and upper arm, initial encounter: Secondary | ICD-10-CM | POA: Diagnosis present

## 2021-09-09 DIAGNOSIS — M24811 Other specific joint derangements of right shoulder, not elsewhere classified: Secondary | ICD-10-CM | POA: Diagnosis not present

## 2021-09-09 DIAGNOSIS — W01198A Fall on same level from slipping, tripping and stumbling with subsequent striking against other object, initial encounter: Secondary | ICD-10-CM | POA: Diagnosis not present

## 2021-09-09 DIAGNOSIS — S43101A Unspecified dislocation of right acromioclavicular joint, initial encounter: Secondary | ICD-10-CM | POA: Diagnosis not present

## 2021-09-09 DIAGNOSIS — Y9322 Activity, ice hockey: Secondary | ICD-10-CM | POA: Insufficient documentation

## 2021-09-09 DIAGNOSIS — F17210 Nicotine dependence, cigarettes, uncomplicated: Secondary | ICD-10-CM | POA: Insufficient documentation

## 2021-09-09 MED ORDER — HYDROCODONE-ACETAMINOPHEN 5-325 MG PO TABS: 1.0000 | ORAL_TABLET | Freq: Four times a day (QID) | ORAL | 0 refills | Status: AC | PRN

## 2021-09-09 MED ORDER — IBUPROFEN 800 MG PO TABS
800.0000 mg | ORAL_TABLET | Freq: Once | ORAL | Status: AC
  Administered 2021-09-09: 23:00:00 800 mg via ORAL
  Filled 2021-09-09: qty 1

## 2021-09-09 MED ORDER — IBUPROFEN 800 MG PO TABS: 800.0000 mg | ORAL_TABLET | Freq: Three times a day (TID) | ORAL | 0 refills | Status: AC

## 2021-09-09 NOTE — ED Provider Notes (Signed)
[facility] EMERGENCY DEPT Provider Note   CSN: [id number] Arrival date & time: 09/09/21  1920     History Chief Complaint  Patient presents with   Shoulder Pain    Musa Rewerts is a 40 y.o. male.  HPI Patient was playing hockey.  He was tripped and fell onto his chest sliding across ice.  He reports that his right shoulder hit the side board very forcefully.  He reports initially after the injury he can move it more but is becoming more more painful and stiff.  Is getting more difficult to raise and move the shoulder.  No numbness or tingling or weakness.  He denies other injury.  He denies head injury.  No headache.  No neck pain.  No numbness or tingling to the extremities.  No chest pain or shortness of breath.    History reviewed. No pertinent past medical history.  Patient Active Problem List   Diagnosis Date Noted   Rhabdomyolysis 04/25/2011    History reviewed. No pertinent surgical history.     No family history on file.  Social History   Tobacco Use   Smoking status: Every Day    Types: Cigarettes   Smokeless tobacco: Never  Vaping Use   Vaping Use: Never used  Substance Use Topics   Alcohol use: Yes    Comment: occ   Drug use: Never    Home Medications Prior to Admission medications   Medication Sig Start Date End Date Taking? Authorizing Provider  HYDROcodone-acetaminophen (NORCO/VICODIN) 5-325 MG tablet Take 1-2 tablets by mouth every 6 (six) hours as needed for moderate pain or severe pain. 09/09/21  Yes Arby Barrette, MD  ibuprofen (ADVIL) 800 MG tablet Take 1 tablet (800 mg total) by mouth 3 (three) times daily. 09/09/21  Yes Arby Barrette, MD  acetaminophen (TYLENOL) 325 MG tablet Take 325 mg by mouth every 6 (six) hours as needed.      Duwaine Maxin, MD  famotidine (PEPCID) 20 MG tablet Take 1 tablet (20 mg total) by mouth 2 (two) times daily. 05/06/19   Maxwell Caul, PA-C  sucralfate (CARAFATE) 1 GM/10ML suspension  Take 10 mLs (1 g total) by mouth 4 (four) times daily -  with meals and at bedtime. 05/06/19   Maxwell Caul, PA-C    Allergies    Patient has no known allergies.  Review of Systems   Review of Systems Neurologic: No headache no confusion no visual changes Respiratory: No shortness of breath no chest pain GI: No abdominal pain no nausea no vomiting Physical Exam Updated Vital Signs BP 125/86 (BP Location: Right Arm)   Pulse 80   Temp 99.2 F (37.3 C) (Oral)   Resp 16   Ht 5\' 11"  (1.803 m)   Wt 93 kg   SpO2 100%   BMI 28.59 kg/m   Physical Exam Constitutional:      Comments: Alert nontoxic.  No respiratory distress, GCS 15  HENT:     Head: Normocephalic and atraumatic.     Mouth/Throat:     Pharynx: Oropharynx is clear.  Eyes:     Extraocular Movements: Extraocular movements intact.  Neck:     Comments: No C-spine tenderness Cardiovascular:     Rate and Rhythm: Normal rate and regular rhythm.  Pulmonary:     Effort: Pulmonary effort is normal.     Breath sounds: Normal breath sounds.     Comments: Chest wall is nontender to compression.  Firm compression in lateral and  AP elicits no pain.  No contusions or abrasions to chest wall. Abdominal:     General: There is no distension.     Palpations: Abdomen is soft.  Musculoskeletal:     Cervical back: Neck supple.     Comments: Visual inspection of the right shoulder does not show gross deformity or step-off.  No visible contusion or abrasion at this time.  Significant tenderness palpation over the distal clavicle particularly focusing at about the [facility] joint.  A lot of pain at the point of the shoulder.  Patient is able to do abduction to about 60 degrees.  Beyond that very painful.  Patient has some forward and posterior flexion about 60 degrees.  No effusion or swelling of the elbow.  No tenderness to bony palpation of the elbow wrist or hand.  Strength testing of the hand and wrist are normal.  Hand is warm and dry.   Radial pulse 2+.  Skin:    General: Skin is warm and dry.  Neurological:     General: No focal deficit present.     Mental Status: He is oriented to person, place, and time.     Coordination: Coordination normal.  Psychiatric:        Mood and Affect: Mood normal.    ED Results / Procedures / Treatments   Labs (all labs ordered are listed, but only abnormal results are displayed) Labs Reviewed - No data to display  EKG None  Radiology DG Shoulder Right  Result Date: 09/09/2021 CLINICAL DATA:  Injured playing hockey.  Right shoulder pain. EXAM: RIGHT SHOULDER - 2+ VIEW COMPARISON:  None. FINDINGS: There is no evidence of fracture or dislocation. There is no evidence of arthropathy or other focal bone abnormality. Soft tissues are unremarkable. IMPRESSION: Normal radiographs. Electronically Signed   By: Paulina Fusi M.D.   On: 09/09/2021 20:31    Procedures Procedures   Medications Ordered in ED Medications  ibuprofen (ADVIL) tablet 800 mg (800 mg Oral Given 09/09/21 2242)    ED Course  I have reviewed the triage vital signs and the nursing notes.  Pertinent labs & imaging results that were available during my care of the patient were reviewed by me and considered in my medical decision making (see chart for details).    MDM Rules/Calculators/A&P                           Patient in direct trauma to the right shoulder as outlined.  No signs of head injury neck injury or thoracic injury.  I have high suspicion for AC separation.  Also possible rotator cuff tear or other internal shoulder derangement.  Patient is neurovascularly intact.  X-rays do not show acute findings.  I have also reviewed the x-rays myself.  At this time we will give ibuprofen for pain.  I counseled the patient on use of ibuprofen and ice for main pain control.  Vicodin given for additional pain control as needed.  Reviewed the nature of AC separation and rotator cuff tear with possible need for further  imaging and possibly surgical repair if significant soft tissue injury.  Patient is aware this requires follow-up with orthopedics for repeat examination and then determination if additional imaging such as MRI is needed.  Patient voices understanding. Final Clinical Impression(s) / ED Diagnoses Final diagnoses:  Separation of right acromioclavicular joint, initial encounter  Internal derangement of right shoulder    Rx / DC Orders ED  Discharge Orders          Ordered    ibuprofen (ADVIL) 800 MG tablet  3 times daily        09/09/21 2235    HYDROcodone-acetaminophen (NORCO/VICODIN) 5-325 MG tablet  Every 6 hours PRN        09/09/21 2235             Arby Barrette, MD 09/09/21 2246

## 2021-09-09 NOTE — ED Notes (Signed)
Patient comfortable at this Time. Call Bell within reach. Ice previously applied by other ED Staff. No Needs at this Time. Staff will continue to Monitor.

## 2021-09-09 NOTE — ED Notes (Signed)
RN provided AVS using Teachback Method. Patient verbalizes understanding of Discharge Instructions. Opportunity for Questioning and Answers were provided by RN. Patient Discharged from ED. Patient ambulatory off department.

## 2021-09-09 NOTE — Discharge Instructions (Addendum)
1.  Wear sling for comfort.  You may remove your sling to bathe and for limited amount of motion at the shoulder as tolerated. 2.  Apply well wrapped ice pack for 20 minutes every couple of hours for the next 48 hours.  Take ibuprofen 800 mg every 8 hours.  You may take 1-2 Vicodin tablets every 6 hours if additional pain control as needed. 3.  Schedule follow-up appointment with the orthopedic physician as soon as possible.  Call the number listed for Dr. Magnus Ivan tomorrow to get your follow-up scheduled.  You will need further evaluation and possibly additional  imaging for your shoulder injury

## 2021-09-09 NOTE — ED Triage Notes (Signed)
Pt was playing hockey and slid into a wall. C/o right shoulder pain.

## 2022-10-02 IMAGING — DX DG SHOULDER 2+V*R*
3 series · 3 of 3 positions shown · non-contrast
Comparison: None.

CLINICAL DATA: Injured playing [employer].  Right shoulder pain.

EXAM:
RIGHT SHOULDER - 2+ VIEW

[shoulder grashey]
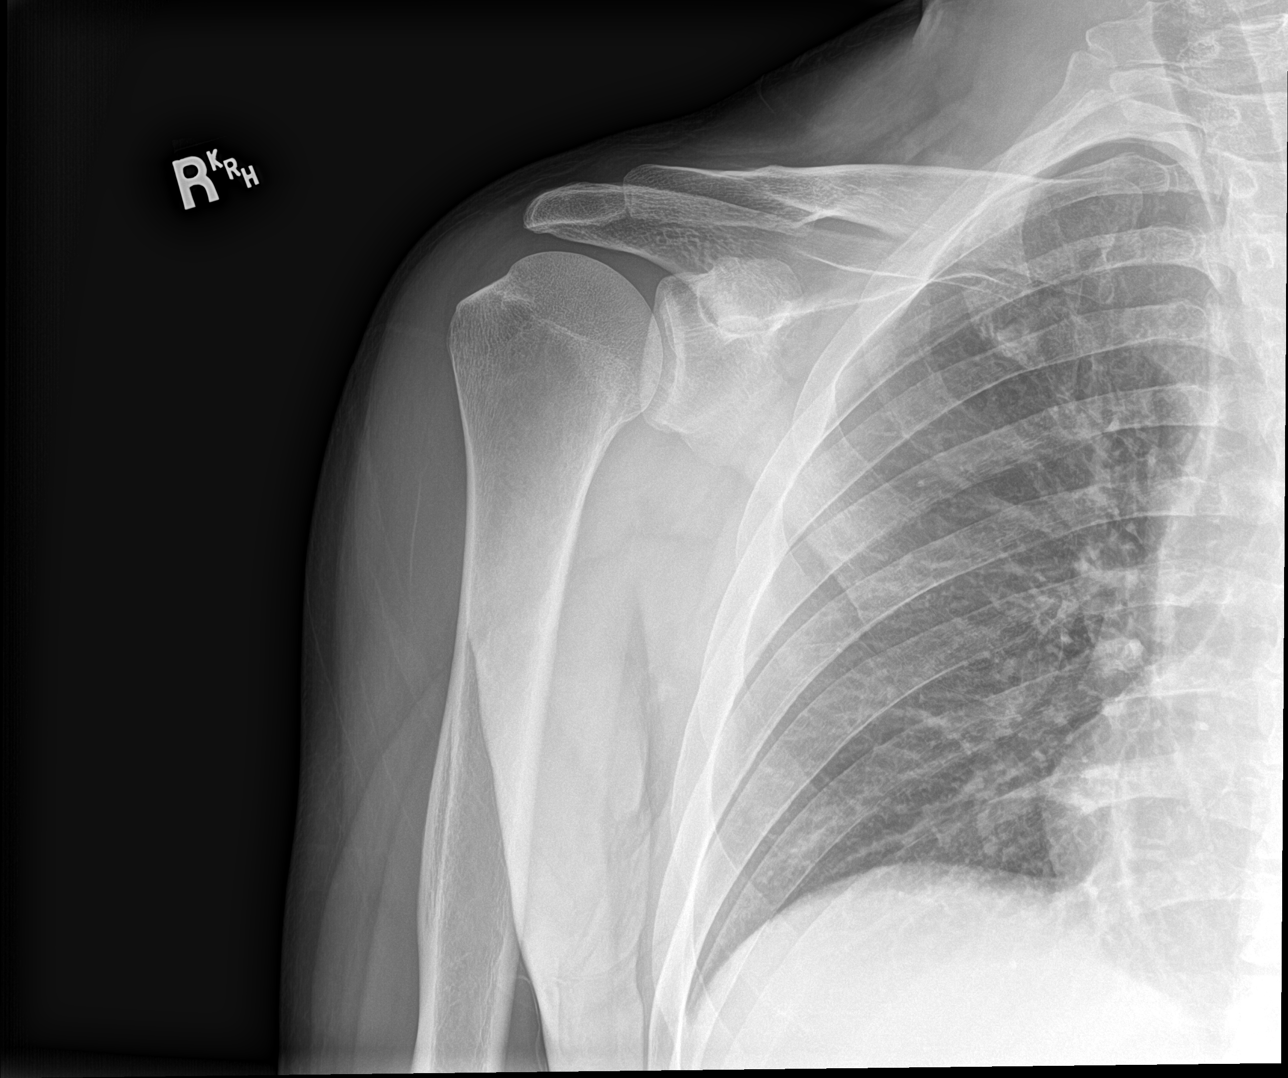

[shoulder y view]
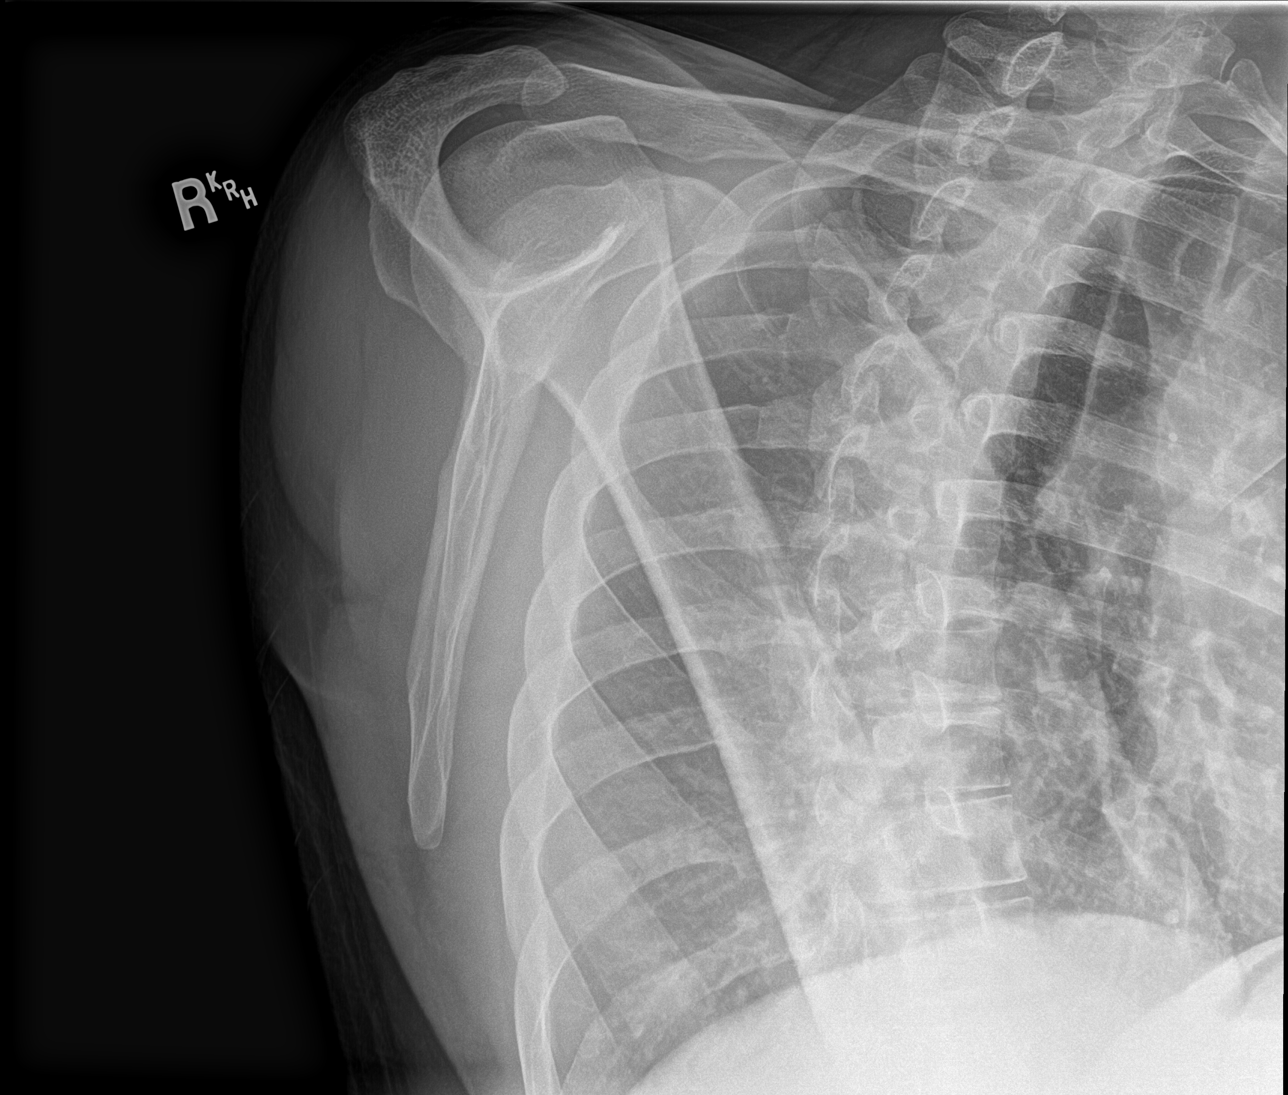

[shoulder axillary]
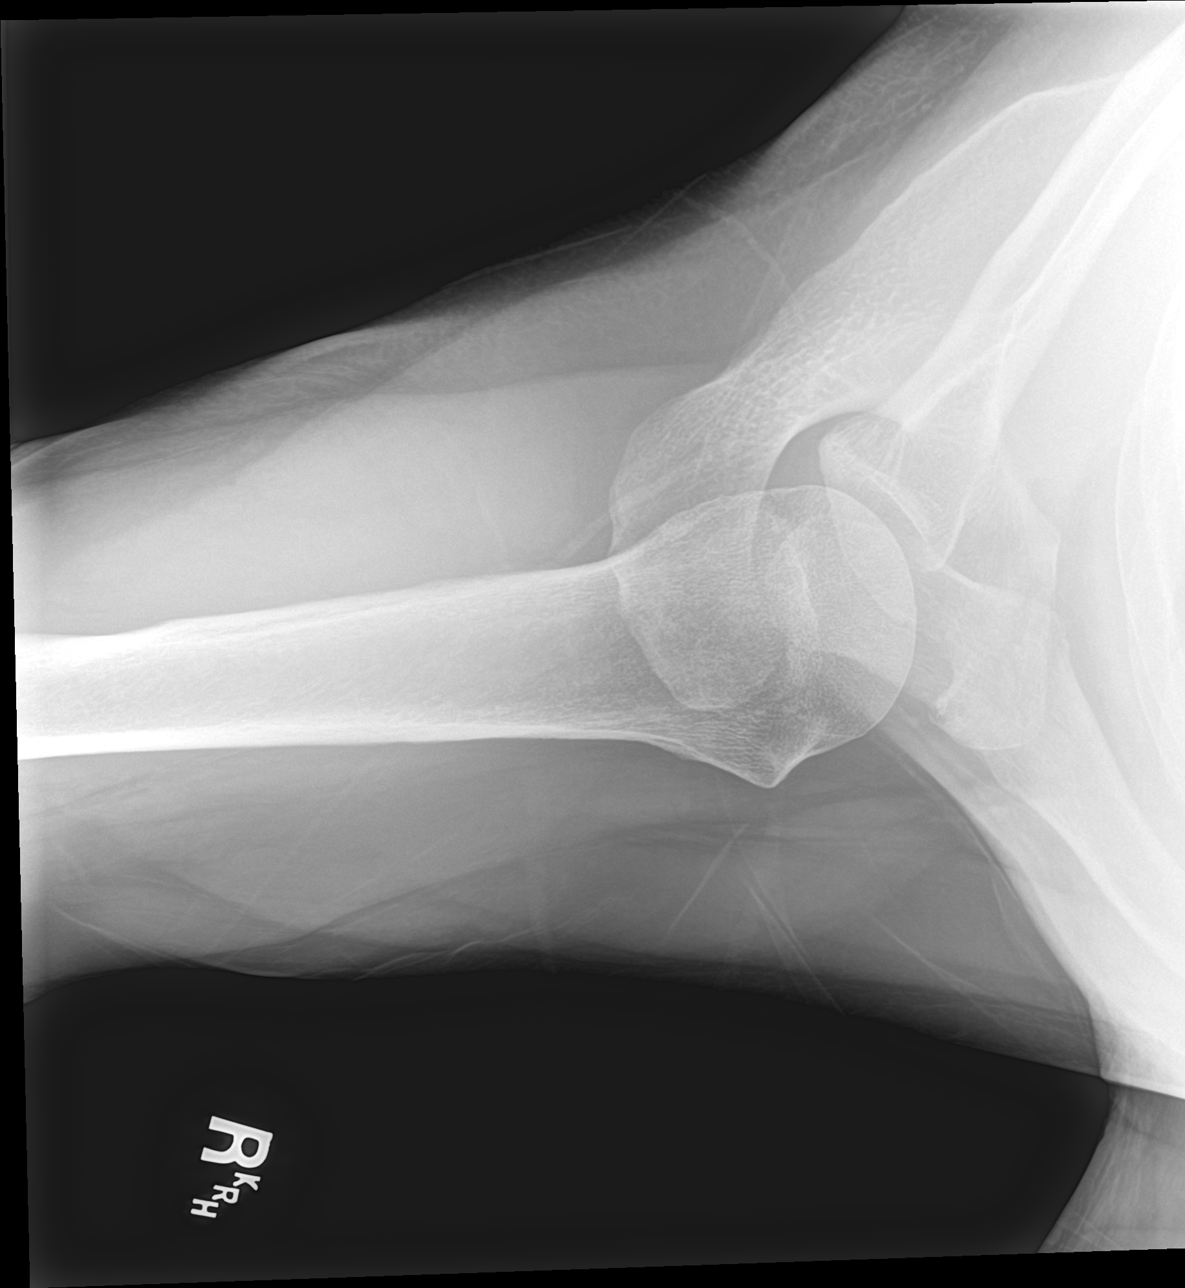

[3 of 3 positions shown; findings below may reference images not displayed]

FINDINGS: There is no evidence of fracture or dislocation. There is no
evidence of arthropathy or other focal bone abnormality. Soft
tissues are unremarkable.
IMPRESSION: Normal radiographs.

## 2023-09-09 ENCOUNTER — Ambulatory Visit: Payer: Self-pay | Admitting: Allergy & Immunology

## 2023-09-22 ENCOUNTER — Ambulatory Visit: Payer: Self-pay | Admitting: Allergy

## 2023-09-22 NOTE — Progress Notes (Unsigned)
New Patient Note  RE: Sean Andrews MRN: [id number] DOB: May 25, 1981 Date of Office Visit: 09/22/2023  Consult requested by: Cyril Mourning, FNP Primary care provider: Patient, No Pcp Per  Chief Complaint: No chief complaint on file.  History of Present Illness: I had the pleasure of seeing Sean Andrews for initial evaluation at the [facility] on 09/22/2023. He is a 42 y.o. male, who is referred here by Patient, No Pcp Per for the evaluation of food allergy.  Discussed the use of AI scribe software for clinical note transcription with the patient, who gave verbal consent to proceed.  History of Present Illness             He reports food allergy to ***. The reaction occurred at the age of ***, after he ate *** amount of ***. Symptoms started within *** and was in the form of *** hives, swelling, wheezing, abdominal pain, diarrhea, vomiting. ***Denies any associated cofactors such as exertion, infection, NSAID use, or alcohol consumption. The symptoms lasted for ***. He was evaluated in ED and received ***. Since this episode, he does *** not report other accidental exposures to ***. He does *** not have access to epinephrine autoinjector and *** needed to use it.   Past work up includes: ***. Dietary History: patient has been eating other foods including ***milk, ***eggs, ***peanut, ***treenuts, ***sesame, ***shellfish, ***fish, ***soy, ***wheat, ***meats, ***fruits and ***vegetables.  He reports reading labels and avoiding *** in diet completely. He tolerates ***baked egg and baked milk products.   Assessment and Plan: Sean Andrews is a 42 y.o. male with: ***  Assessment and Plan               No follow-ups on file.  No orders of the defined types were placed in this encounter.  Lab Orders  No laboratory test(s) ordered today    Other allergy screening: Asthma: {Blank single:19197::"yes","no"} Rhino conjunctivitis: {Blank  single:19197::"yes","no"} Food allergy: {Blank single:19197::"yes","no"} Medication allergy: {Blank single:19197::"yes","no"} Hymenoptera allergy: {Blank single:19197::"yes","no"} Urticaria: {Blank single:19197::"yes","no"} Eczema:{Blank single:19197::"yes","no"} History of recurrent infections suggestive of immunodeficency: {Blank single:19197::"yes","no"}  Diagnostics: Spirometry:  Tracings reviewed. His effort: {Blank single:19197::"Good reproducible efforts.","It was hard to get consistent efforts and there is a question as to whether this reflects a maximal maneuver.","Poor effort, data can not be interpreted."} FVC: ***L FEV1: ***L, ***% predicted FEV1/FVC ratio: ***% Interpretation: {Blank single:19197::"Spirometry consistent with mild obstructive disease","Spirometry consistent with moderate obstructive disease","Spirometry consistent with severe obstructive disease","Spirometry consistent with possible restrictive disease","Spirometry consistent with mixed obstructive and restrictive disease","Spirometry uninterpretable due to technique","Spirometry consistent with normal pattern","No overt abnormalities noted given today's efforts"}.  Please see scanned spirometry results for details.  Skin Testing: {Blank single:19197::"Select foods","Environmental allergy panel","Environmental allergy panel and select foods","Food allergy panel","None","Deferred due to recent antihistamines use"}. *** Results discussed with patient/family.   Past Medical History: Patient Active Problem List   Diagnosis Date Noted  . Rhabdomyolysis 04/25/2011   No past medical history on file. Past Surgical History: No past surgical history on file. Medication List:  Current Outpatient Medications  Medication Sig Dispense Refill  . acetaminophen (TYLENOL) 325 MG tablet Take 325 mg by mouth every 6 (six) hours as needed.      . famotidine (PEPCID) 20 MG tablet Take 1 tablet (20 mg total) by mouth 2 (two)  times daily. 30 tablet 0  . HYDROcodone-acetaminophen (NORCO/VICODIN) 5-325 MG tablet Take 1-2 tablets by mouth every 6 (six) hours as needed for moderate pain or severe pain.  20 tablet 0  . ibuprofen (ADVIL) 800 MG tablet Take 1 tablet (800 mg total) by mouth 3 (three) times daily. 30 tablet 0  . sucralfate (CARAFATE) 1 GM/10ML suspension Take 10 mLs (1 g total) by mouth 4 (four) times daily -  with meals and at bedtime. 420 mL 0   No current facility-administered medications for this visit.   Allergies: No Known Allergies Social History: Social History   Socioeconomic History  . Marital status: Married    Spouse name: Not on file  . Number of children: Not on file  . Years of education: Not on file  . Highest education level: Not on file  Occupational History  . Not on file  Tobacco Use  . Smoking status: Every Day    Types: Cigarettes  . Smokeless tobacco: Never  Vaping Use  . Vaping status: Never Used  Substance and Sexual Activity  . Alcohol use: Yes    Comment: occ  . Drug use: Never  . Sexual activity: Not on file  Other Topics Concern  . Not on file  Social History Narrative  . Not on file   Social Drivers of Health   Financial Resource Strain: Low Risk  (07/25/2023)   Received from [facility]   Overall Financial Resource Strain (CARDIA)   . Difficulty of Paying Living Expenses: Not hard at all  Food Insecurity: No Food Insecurity (07/25/2023)   Received from [facility]   Hunger Vital Sign   . Worried About Programme researcher, broadcasting/film/video in the Last Year: Never true   . Ran Out of Food in the Last Year: Never true  Transportation Needs: No Transportation Needs (07/25/2023)   Received from [facility] - Transportation   . Lack of Transportation (Medical): No   . Lack of Transportation (Non-Medical): No  Physical Activity: Sufficiently Active (07/25/2023)   Received from [facility]   Exercise Vital Sign   . Days of Exercise per Week: 3 days   .  Minutes of Exercise per Session: 60 min  Stress: No Stress Concern Present (07/25/2023)   Received from [facility] - Occupational Stress Questionnaire   . Feeling of Stress : Only a little  Social Connections: Moderately Integrated (07/25/2023)   Received from [facility]   Social Network   . How would you rate your social network (family, work, friends)?: Adequate participation with social networks   Lives in a ***. Smoking: *** Occupation: ***  Environmental HistorySurveyor, minerals in the house: Copywriter, advertising in the family room: {Blank single:19197::"yes","no"} Carpet in the bedroom: {Blank single:19197::"yes","no"} Heating: {Blank single:19197::"electric","gas","heat pump"} Cooling: {Blank single:19197::"central","window","heat pump"} Pet: {Blank single:19197::"yes ***","no"}  Family History: No family history on file. Problem                               Relation Asthma                                   *** Eczema                                *** Food allergy                          ***  Allergic rhino conjunctivitis     ***  Review of Systems  Constitutional:  Negative for appetite change, chills, fever and unexpected weight change.  HENT:  Negative for congestion and rhinorrhea.   Eyes:  Negative for itching.  Respiratory:  Negative for cough, chest tightness, shortness of breath and wheezing.   Cardiovascular:  Negative for chest pain.  Gastrointestinal:  Negative for abdominal pain.  Genitourinary:  Negative for difficulty urinating.  Skin:  Negative for rash.  Neurological:  Negative for headaches.   Objective: There were no vitals taken for this visit. There is no height or weight on file to calculate BMI. Physical Exam Vitals and nursing note reviewed.  Constitutional:      Appearance: Normal appearance. He is well-developed.  HENT:     Head: Normocephalic and atraumatic.      Right Ear: Tympanic membrane and external ear normal.     Left Ear: Tympanic membrane and external ear normal.     Nose: Nose normal.     Mouth/Throat:     Mouth: Mucous membranes are moist.     Pharynx: Oropharynx is clear.  Eyes:     Conjunctiva/sclera: Conjunctivae normal.  Cardiovascular:     Rate and Rhythm: Normal rate and regular rhythm.     Heart sounds: Normal heart sounds. No murmur heard.    No friction rub. No gallop.  Pulmonary:     Effort: Pulmonary effort is normal.     Breath sounds: Normal breath sounds. No wheezing, rhonchi or rales.  Musculoskeletal:     Cervical back: Neck supple.  Skin:    General: Skin is warm.     Findings: No rash.  Neurological:     Mental Status: He is alert and oriented to person, place, and time.  Psychiatric:        Behavior: Behavior normal.  The plan was reviewed with the patient/family, and all questions/concerned were addressed.  It was my pleasure to see Sean Andrews today and participate in his care. Please feel free to contact me with any questions or concerns.  Sincerely,  Wyline Mood, DO Allergy & Immunology  [facility] office: [phone] [facility] office: [phone]

## 2023-10-15 ENCOUNTER — Ambulatory Visit: Payer: Self-pay | Admitting: Allergy
